# Patient Record
Sex: Male | Born: 1995 | Hispanic: Yes | Marital: Single | Smoking: Never smoker
Health system: Southern US, Community
[De-identification: ages and names within clinical notes are randomized; demographics above are authoritative.]

---
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
## 2018-09-17 ENCOUNTER — Emergency Department (HOSPITAL_COMMUNITY): Payer: BLUE CROSS/BLUE SHIELD

## 2018-09-17 ENCOUNTER — Emergency Department (HOSPITAL_COMMUNITY)
Admission: EM | Admit: 2018-09-17 | Discharge: 2018-09-17 | Disposition: A | Discharge status: Home / Self Care | Payer: BLUE CROSS/BLUE SHIELD | Attending: Emergency Medicine | Admitting: Emergency Medicine

## 2018-09-17 ENCOUNTER — Encounter (HOSPITAL_COMMUNITY): Payer: Self-pay | Admitting: *Deleted

## 2018-09-17 ENCOUNTER — Other Ambulatory Visit: Payer: Self-pay

## 2018-09-17 DIAGNOSIS — R361 Hematospermia: Secondary | ICD-10-CM | POA: Insufficient documentation

## 2018-09-17 DIAGNOSIS — N50812 Left testicular pain: Secondary | ICD-10-CM | POA: Insufficient documentation

## 2018-09-17 DIAGNOSIS — Z79899 Other long term (current) drug therapy: Secondary | ICD-10-CM | POA: Insufficient documentation

## 2018-09-17 DIAGNOSIS — N50819 Testicular pain, unspecified: Secondary | ICD-10-CM

## 2018-09-17 LAB — URINALYSIS, ROUTINE W REFLEX MICROSCOPIC
Bilirubin Urine: NEGATIVE
Glucose, UA: NEGATIVE mg/dL
Hgb urine dipstick: NEGATIVE
Ketones, ur: NEGATIVE mg/dL
Leukocytes,Ua: NEGATIVE
Nitrite: NEGATIVE
Protein, ur: NEGATIVE mg/dL
Specific Gravity, Urine: 1.015 (ref 1.005–1.030)
pH: 6 (ref 5.0–8.0)

## 2018-09-17 MED ORDER — LIDOCAINE HCL (PF) 1 % IJ SOLN
5.0000 mL | Freq: Once | INTRAMUSCULAR | Status: DC
  Filled 2018-09-17: qty 5

## 2018-09-17 MED ORDER — AZITHROMYCIN 250 MG PO TABS
1000.0000 mg | ORAL_TABLET | Freq: Once | ORAL | Status: DC
  Filled 2018-09-17: qty 4

## 2018-09-17 MED ORDER — CEFTRIAXONE SODIUM 250 MG IJ SOLR
250.0000 mg | Freq: Once | INTRAMUSCULAR | Status: DC
  Filled 2018-09-17: qty 250

## 2018-09-17 NOTE — ED Triage Notes (Signed)
Pt noticed blood when he ejaculated on 5/17.  Was seen at [facility] and they told him it would pass.  However, pt c/o intermittent bil testicular pain (R /L - at different times) and feels "inflamed".

## 2018-09-17 NOTE — ED Provider Notes (Signed)
Received patient at signout from [facility].  Refer to provider note for full history and physical examination.  Briefly, patient is a 23 year old male with history of hematospermia since 5/17.  Noted only upon an ejaculation.  No other associated symptoms.  At times left right testicles are intermittently achy or full in sensation but he denies this presently.  He was treated for STDs prophylactically in the ED.  Awaiting UA and ultrasound.  If results are reassuring, patient likely stable for discharge home with follow-up with urology on an outpatient basis.    MDM  Ultrasound positive for only small hydroceles bilaterally.  No acute abnormalities.  UA unremarkable.  Patient stable for discharge with follow-up with urology on an outpatient basis.       Jeanie Sewer, PA-C 09/17/18 1605    Cathren Laine, MD 09/18/18 [phone]

## 2018-09-17 NOTE — ED Provider Notes (Signed)
[facility] EMERGENCY DEPARTMENT Provider Note   CSN: [id number] Arrival date & time: 09/17/18  1410    History   Chief Complaint Chief Complaint  Patient presents with  . Testicle Pain    HPI Lance Turner is a 23 y.o. male without pmh here for evaluation of blood in ejaculate. Describes it as entire ejaculate it dark bloody. This first noticed on 5/17 has occurred a total of two times.  He has had normal ejaculate since the 17th.  Went to UC on 5/19 and had a normal urinalysis and was told it would pass but since he has noticed associated intermittent left testicular "fullness" and warmth and right testicular "deep ache", fleeting that comes and goes. He has no testicular pain or swelling currently.  Sexually active with females only. Last sexual intercourse 2018. Had unprotected oral sex December 2019. No interventions. No alleviating or aggravating factors.   No urethral or pelvic trauma, recent urological surgeries or biopsies, foleys or history of radiation to the pelvis.  Does not take any medicines. No associated fever chills, nausea, dysuria, hematuria, urinary frequency or leaking, penile discharge, genital lesions, rectal discomfort or drainage. No h/o STDs.     HPI  History reviewed. No pertinent past medical history.  There are no active problems to display for this patient.   History reviewed. No pertinent surgical history.      Home Medications    Prior to Admission medications   Medication Sig Start Date End Date Taking? Authorizing Provider  cephALEXin (KEFLEX) 500 MG capsule Take 1 capsule (500 mg total) by mouth 4 (four) times daily. 02/06/12   Lawyer, Cristal Deer, PA-C  HYDROcodone-acetaminophen (NORCO/VICODIN) 5-325 MG per tablet Take 1 tablet by mouth every 6 (six) hours as needed for pain. 02/06/12   Lawyer, Cristal Deer, PA-C  ibuprofen (ADVIL,MOTRIN) 200 MG tablet Take 200 mg by mouth every 6 (six) hours as needed. For pain or fever     [provider]  ibuprofen (ADVIL,MOTRIN) 800 MG tablet Take 1 tablet (800 mg total) by mouth every 8 (eight) hours as needed for pain. 02/06/12   Lawyer, Cristal Deer, PA-C    Family History No family history on file.  Social History Social History   Tobacco Use  . Smoking status: Never Smoker  . Smokeless tobacco: Never Used  Substance Use Topics  . Alcohol use: Yes    Comment: occ  . Drug use: No     Allergies   Patient has no known allergies.   Review of Systems Review of Systems  Genitourinary: Positive for scrotal swelling and testicular pain.       Hematospermia   All other systems reviewed and are negative.    Physical Exam Updated Vital Signs BP 129/82 (BP Location: Right Arm)   Pulse 71   Temp 98.4 F (36.9 C) (Oral)   Resp 16   Ht 5\' 9"  (1.753 m)   Wt 77.1 kg   SpO2 95%   BMI 25.10 kg/m   Physical Exam Vitals signs and nursing note reviewed.  Constitutional:      General: He is not in acute distress.    Appearance: He is well-developed.     Comments: NAD.  HENT:     Head: Normocephalic and atraumatic.     Right Ear: External ear normal.     Left Ear: External ear normal.     Nose: Nose normal.  Eyes:     General: No scleral icterus.    Conjunctiva/sclera: Conjunctivae  normal.  Neck:     Musculoskeletal: Normal range of motion and neck supple.  Cardiovascular:     Rate and Rhythm: Normal rate and regular rhythm.     Heart sounds: Normal heart sounds.  Pulmonary:     Effort: Pulmonary effort is normal.     Breath sounds: Normal breath sounds.  Abdominal:     General: Abdomen is flat.     Tenderness: There is no abdominal tenderness.     Comments: No lower abd tenderness. No suprapubic or CVA tenderness. No inguinal bulging/hernias when standing. No inguinal tenderness.  Genitourinary:    Comments:  ER RN Steward Drone present during exam. External genitalia normal without erythema, edema, tenderness or lesions.  Non circumcised male.  No groin lymphadenopathy.  No meatus discharge. Glans and shaft smooth without tenderness, lesions, masses or deformity. Scrotum without lesions or edema. Non tender testicles. Epididymis and spermatic cord without tenderness or masses, bilaterally. Cremasteric reflex intact. Musculoskeletal: Normal range of motion.        General: No deformity.  Skin:    General: Skin is warm and dry.     Capillary Refill: Capillary refill takes less than 2 seconds.  Neurological:     Mental Status: He is alert and oriented to person, place, and time.  Psychiatric:        Behavior: Behavior normal.        Thought Content: Thought content normal.        Judgment: Judgment normal.      ED Treatments / Results  Labs (all labs ordered are listed, but only abnormal results are displayed) Labs Reviewed  URINALYSIS, ROUTINE W REFLEX MICROSCOPIC - Abnormal; Notable for the following components:      Result Value   Color, Urine STRAW (*)    All other components within normal limits  URINE CULTURE  GC/CHLAMYDIA PROBE AMP ([place]) NOT AT [facility]    EKG None  Radiology US Scrotum W/doppler  Result Date: 09/17/2018 CLINICAL DATA:  Left testicular fullness. Hematospermia. Rule out torsion. EXAM: SCROTAL ULTRASOUND DOPPLER ULTRASOUND OF THE TESTICLES TECHNIQUE: Complete ultrasound examination of the testicles, epididymis, and other scrotal structures was performed. Color and spectral Doppler ultrasound were also utilized to evaluate blood flow to the testicles. COMPARISON:  None. FINDINGS: Right testicle Measurements: 4.4 x 2.7 x 2.5 cm. No mass or microlithiasis visualized. Left testicle Measurements: 4.3 x 1.8 x 3.1 cm. No mass or microlithiasis visualized. Right epididymis:  Normal in size and appearance. Left epididymis:  3 mm cyst otherwise negative Hydrocele:  Small bilateral hydrocele Varicocele:  None visualized. Pulsed Doppler interrogation of both testes demonstrates normal low resistance arterial  and venous waveforms bilaterally. IMPRESSION: Negative for torsion or mass. Small hydrocele bilaterally. No acute abnormality. Electronically Signed   By: Marlan Palau M.D.   On: 09/17/2018 15:54    Procedures Procedures (including critical care time)  Medications Ordered in ED Medications  cefTRIAXone (ROCEPHIN) injection 250 mg (250 mg Intramuscular Refused 09/17/18 1650)  azithromycin (ZITHROMAX) tablet 1,000 mg (1,000 mg Oral Refused 09/17/18 1651)  lidocaine (PF) (XYLOCAINE) 1 % injection 5 mL (5 mLs Intradermal Refused 09/17/18 1651)     Initial Impression / Assessment and Plan / ED Course  I have reviewed the triage vital signs and the nursing notes.  Pertinent labs & imaging results that were available during my care of the patient were reviewed by me and considered in my medical decision making (see chart for details).  23 yo sexually active M here with hematospermia, vague mild intermittent left testicular fullness and right testicular ache.  Normal GU exam.  No abdominal or inguinal hernia or tenderness. No urethral or pelvic trauma, recent urological surgeries or biopsies, foleys or history of radiation to the pelvis.  No associated symptoms of urethritis/STD or UTI.  No aspirin or anticoagulants.  Overall low risk for STD, urethritis, epididymitis given sexual practices but gonorrhea, chlamydia swabs sent and he was empirically treated for this in ED.  UA was normal without RBCs, WBCs, infection.  Low concern for other pathology such as GU tumors, seminal vesicle abnormalities, infectious process or other life threatening process.  Given his intermittent testicular discomfort and patient concern testicular US will be obtained.   1545: Pt handed off to oncoming EDPA Fawze who will f/u on US.  Anticipate discharge unless there is surgical or concerning findings.  Discussed POC with patient.  Plan to discharge with ibuprofen/tylenol for intermittent pain, urology f/u for  persistent symptoms. Return precautions given.   Final Clinical Impressions(s) / ED Diagnoses   Final diagnoses:  Hematospermia  Testicular discomfort    ED Discharge Orders    None       Jerrell MylarGibbons, Claudia J, PA-C 09/17/18 1843    Eber HongMiller, Brian, MD 09/18/18 1046

## 2018-09-17 NOTE — Discharge Instructions (Addendum)
You were seen in the ED for blood in your ejaculate, intermittent left testicular fullness, intermittent right testicular ache.  Your ultrasound was normal and your urine test was also normal.  The cause of your symptoms is still unclear but likely benign.  We have tested you for gonorrhea and chlamydia.  These results are pending and usually take up to 1 week to return.  We opted to treat you today for gonorrhea and chlamydia.  You will be notified if these results are positive via phone call.  You can also check my chart to review your results.  You may take ibuprofen or acetaminophen as needed for any discomfort.  Always wear a condom with any sexual encounters.  Do not have any sexual encounters until your symptoms have completely resolved.  Follow-up with urology if the symptoms persist more than 1 month.  Return to the ED if there is fevers, chills, penile discharge or pain, genital swelling, redness or lesions

## 2018-09-18 LAB — URINE CULTURE: Culture: <10000 — AB

## 2018-09-20 LAB — GC/CHLAMYDIA PROBE AMP (~~LOC~~) NOT AT ARMC
Chlamydia: NEGATIVE
Neisseria Gonorrhea: NEGATIVE

## 2018-11-02 ENCOUNTER — Ambulatory Visit: Payer: Self-pay | Admitting: Urology

## 2021-01-18 IMAGING — US ULTRASOUND SCROTUM DOPPLER COMPLETE
1 series · 14 of 25 positions shown · non-contrast
Comparison: None.

CLINICAL DATA: Left testicular fullness. Hematospermia. Rule out
torsion.

EXAM:
SCROTAL ULTRASOUND
DOPPLER ULTRASOUND OF THE TESTICLES
TECHNIQUE: Complete ultrasound examination of the testicles, epididymis, and
other scrotal structures was performed. Color and spectral Doppler
ultrasound were also utilized to evaluate blood flow to the
testicles.

[Series 1: ultrasound scrotum doppler complete · 14 of 64 slices shown]
[im 1/64]
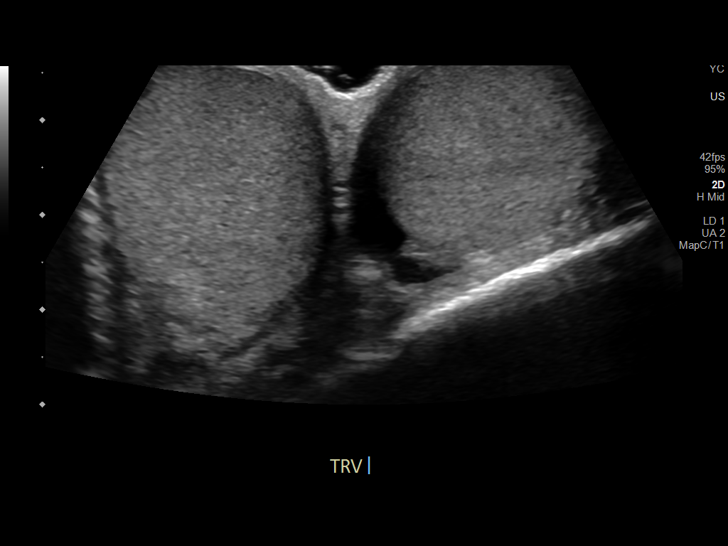
[im 6/64]
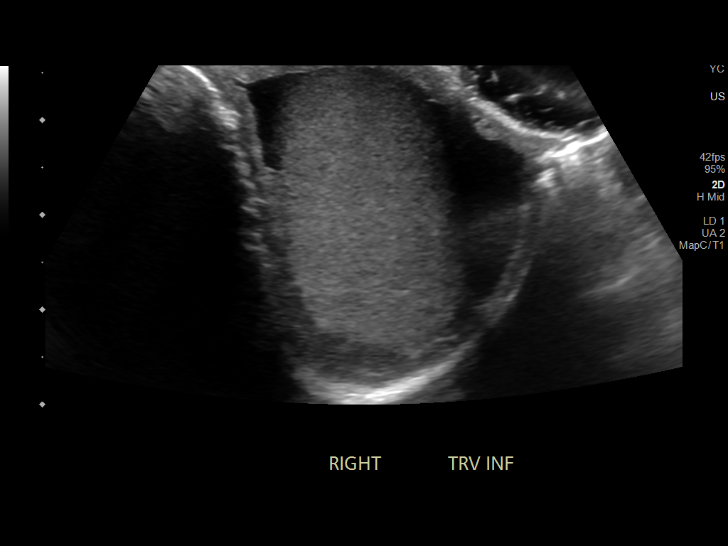
[im 11/64]
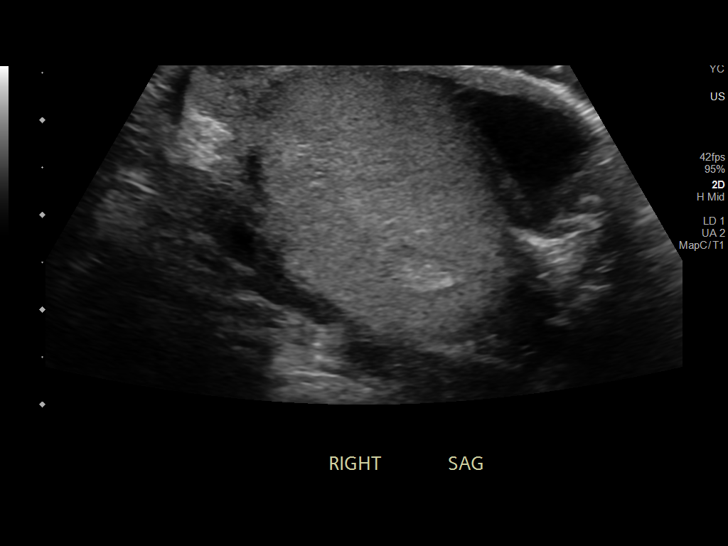
[im 16/64]
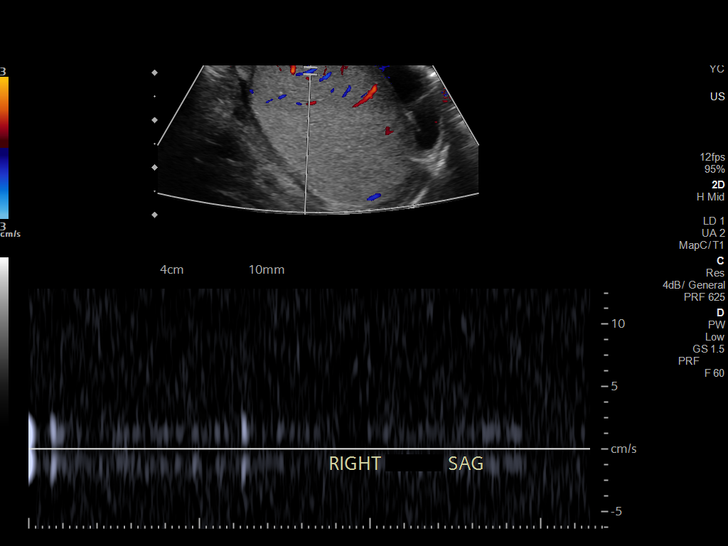
[im 22/64]
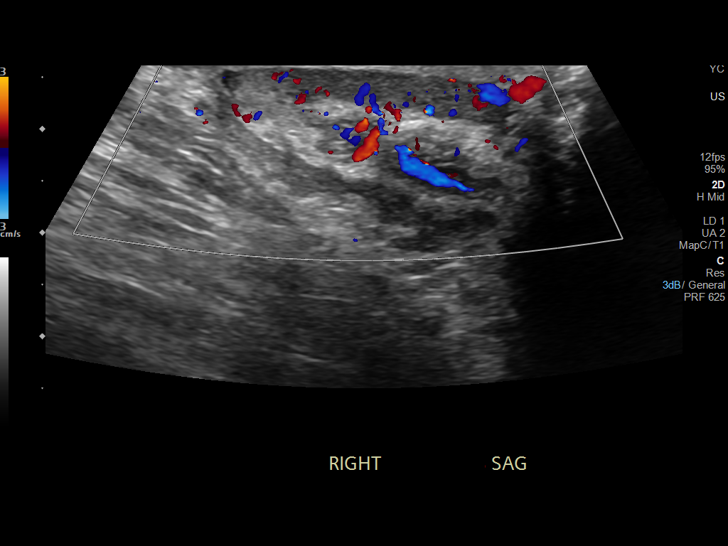
[im 24/64]
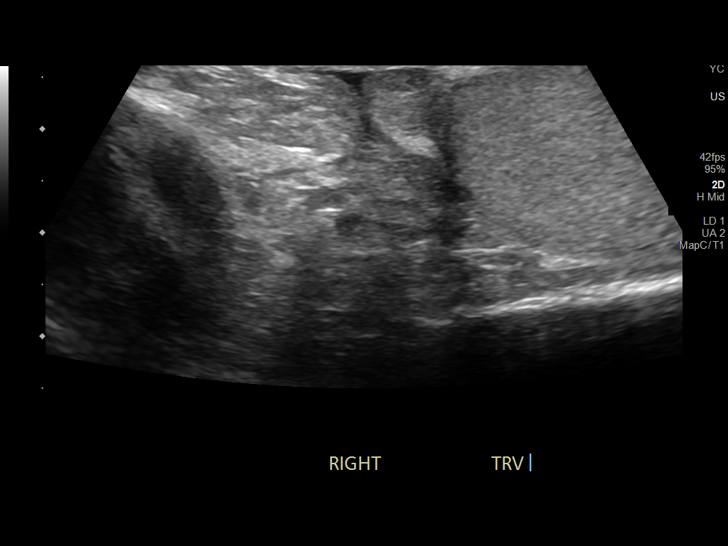
[im 29/64]
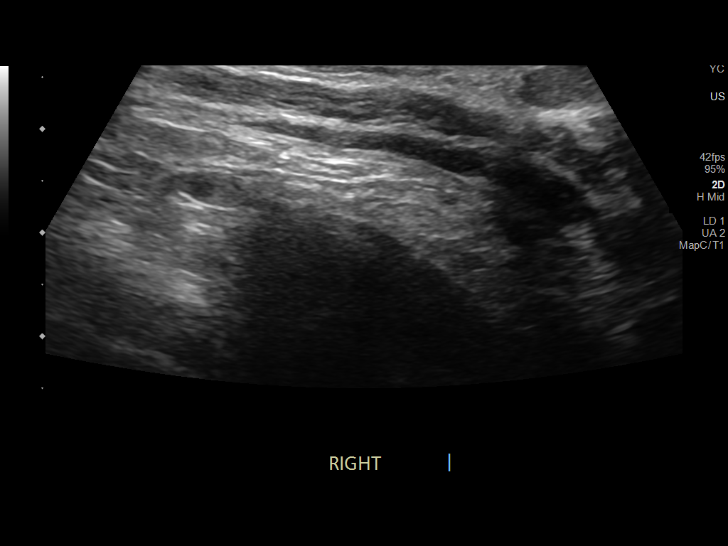
[im 35/64]
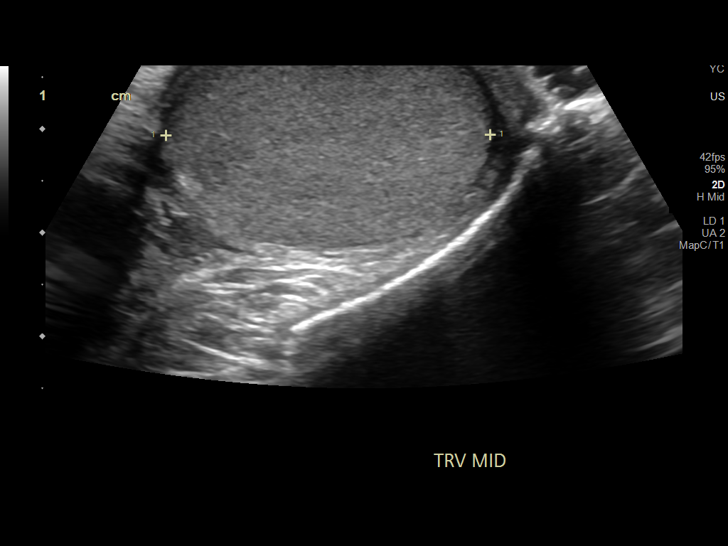
[im 40/64]
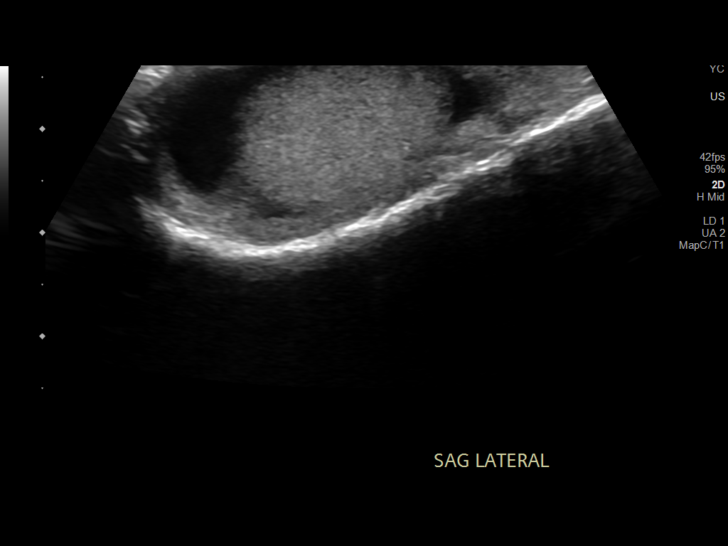
[im 43/64]
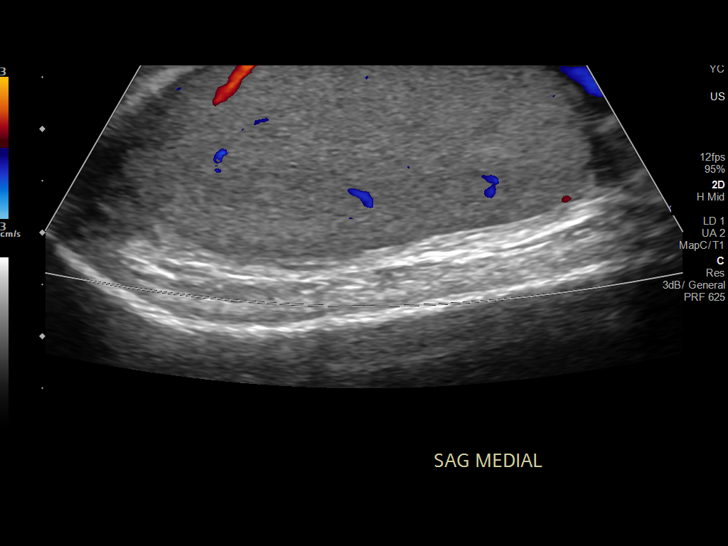
[im 48/64]
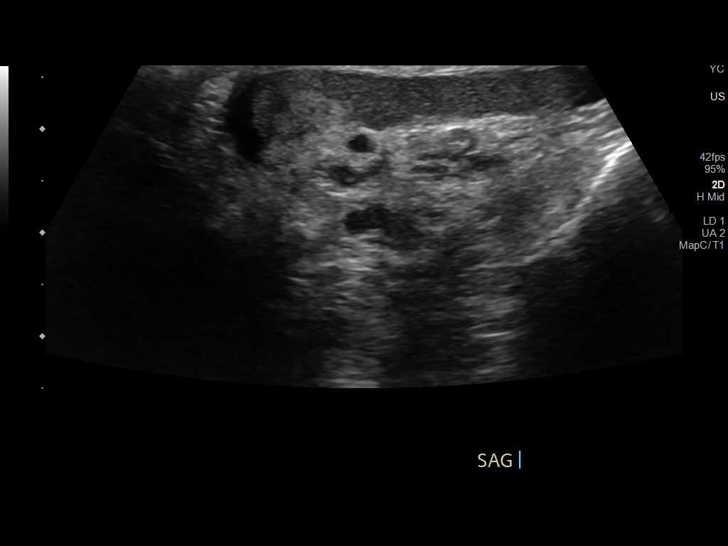
[im 53/64]
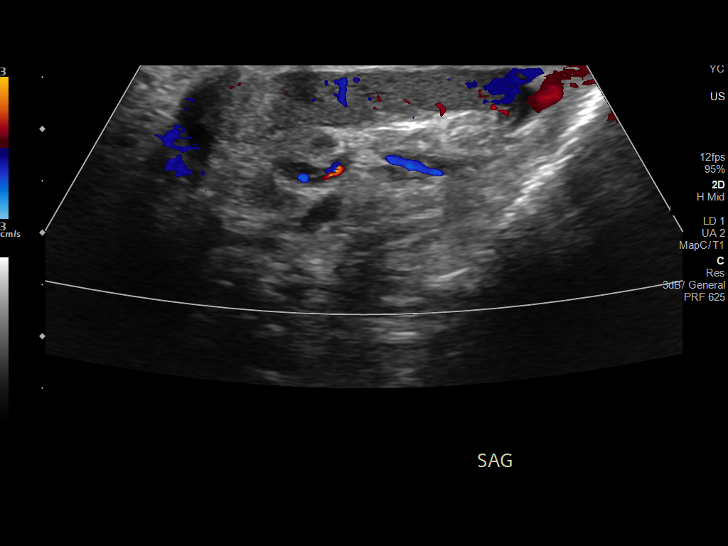
[im 58/64]
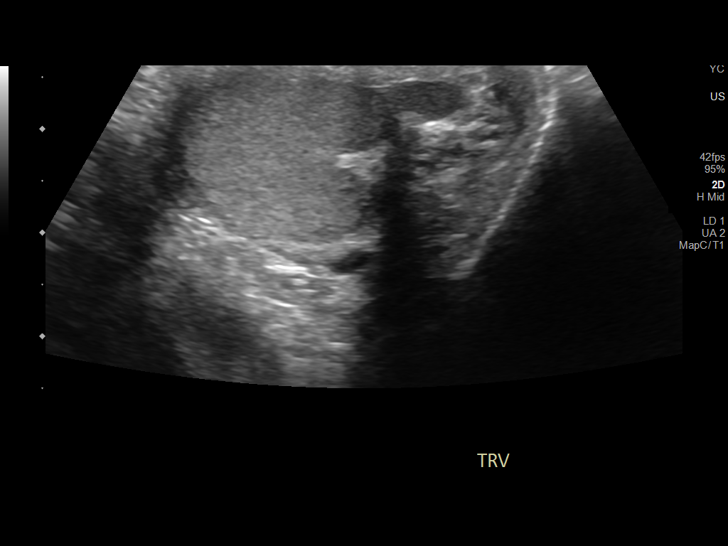
[im 64/64]
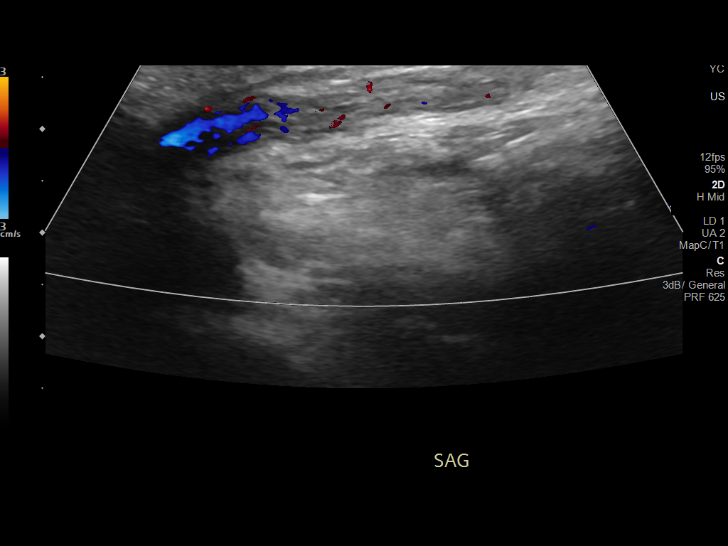

[14 of 25 positions shown; findings below may reference images not displayed]

FINDINGS: Right testicle

Measurements: 4.4 x 2.7 x 2.5 cm. No mass or microlithiasis
visualized.

Left testicle

Measurements: 4.3 x 1.8 x 3.1 cm. No mass or microlithiasis
visualized.

Right epididymis:  Normal in size and appearance.

Left epididymis:  3 mm cyst otherwise negative

Hydrocele:  Small bilateral hydrocele

Varicocele:  None visualized.

Pulsed Doppler interrogation of both testes demonstrates normal low
resistance arterial and venous waveforms bilaterally.
IMPRESSION: Negative for torsion or mass. Small hydrocele bilaterally. No acute
abnormality.
